# Patient Record
Sex: Male | Born: 1995 | Hispanic: Yes | Marital: Single | Smoking: Never smoker
Health system: Southern US, Community
[De-identification: ages and names within clinical notes are randomized; demographics above are authoritative.]

---
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
## 2017-05-05 ENCOUNTER — Encounter: Payer: Self-pay | Admitting: Emergency Medicine

## 2017-05-05 ENCOUNTER — Emergency Department
Admission: EM | Admit: 2017-05-05 | Discharge: 2017-05-06 | Disposition: A | Discharge status: Home / Self Care | Payer: Medicaid Other | Attending: Emergency Medicine | Admitting: Emergency Medicine

## 2017-05-05 DIAGNOSIS — Z79899 Other long term (current) drug therapy: Secondary | ICD-10-CM | POA: Insufficient documentation

## 2017-05-05 DIAGNOSIS — N3001 Acute cystitis with hematuria: Secondary | ICD-10-CM | POA: Insufficient documentation

## 2017-05-05 LAB — URINALYSIS, COMPLETE (UACMP) WITH MICROSCOPIC
Bilirubin Urine: NEGATIVE
Glucose, UA: NEGATIVE mg/dL
KETONES UR: NEGATIVE mg/dL
Nitrite: NEGATIVE
PROTEIN: 100 mg/dL — AB
SQUAMOUS EPITHELIAL / LPF: NONE SEEN
Specific Gravity, Urine: 1.024 (ref 1.005–1.030)
pH: 5 (ref 5.0–8.0)

## 2017-05-05 NOTE — ED Triage Notes (Signed)
Patient comes to ER with co increased frequency and burning urination for the last week.  He says it has had some blood in it.  VS are WNL

## 2017-05-05 NOTE — ED Notes (Signed)
Post void residual- 0 ml.

## 2017-05-05 NOTE — ED Notes (Signed)
Urine sent on patient.

## 2017-05-05 NOTE — ED Provider Notes (Signed)
[facility] Emergency Department Provider Note  ____________________________________________   First MD Initiated Contact with Patient 05/05/17 2352     (approximate)  I have reviewed the triage vital signs and the nursing notes.   HISTORY  Chief Complaint Dysuria    HPI Jason Ford is a 21 y.o. male who comes to the emergency department with 4 days of hematuria dysuria and lower abdominal discomfort. He denies fevers or chills. He denies flank pain. He was last sexually active 5 months ago with a single partner although he denies condom use.He denies testicular pain. He denies penile discharge. His pain is worse when urinating improved when not urinating. It is moderate severity.    History reviewed. No pertinent past medical history.  There are no active problems to display for this patient.   History reviewed. No pertinent surgical history.  Prior to Admission medications   Medication Sig Start Date End Date Taking? Authorizing Provider  amoxicillin-clavulanate (AUGMENTIN) 875-125 MG per tablet Take 1 tablet by mouth 2 (two) times daily. 04/29/15   Darci CurrentBrown, [place] N, MD  cephALEXin (KEFLEX) 500 MG capsule Take 1 capsule (500 mg total) by mouth 4 (four) times daily. 05/06/17 05/16/17  Merrily Brittleifenbark, Tiffony Kite, MD  cyclobenzaprine (FLEXERIL) 5 MG tablet Take 1 tablet (5 mg total) by mouth every 8 (eight) hours as needed for muscle spasms. 04/02/15   Evon SlackGaines, Thomas C, PA-C  ibuprofen (ADVIL,MOTRIN) 800 MG tablet Take 1 tablet (800 mg total) by mouth every 8 (eight) hours as needed. 04/03/15   Evon SlackGaines, Thomas C, PA-C  ibuprofen (ADVIL,MOTRIN) 800 MG tablet Take 1 tablet (800 mg total) by mouth every 8 (eight) hours as needed for moderate pain. 04/29/15   Joni ReiningSmith, Ronald K, PA-C  traMADol (ULTRAM) 50 MG tablet Take 1 tablet (50 mg total) by mouth every 6 (six) hours as needed for moderate pain. 04/29/15   Joni ReiningSmith, Ronald K, PA-C    Allergies Patient has no known  allergies.  History reviewed. No pertinent family history.  Social History Social History  Substance Use Topics  . Smoking status: Never Smoker  . Smokeless tobacco: Never Used  . Alcohol use No    Review of Systems Constitutional: No fever/chills ENT: No sore throat. Cardiovascular: Denies chest pain. Respiratory: Denies shortness of breath. Gastrointestinal: Positive abdominal pain.  No nausea, no vomiting.  No diarrhea.  No constipation. Musculoskeletal: Negative for back pain. Neurological: Negative for headaches   ____________________________________________   PHYSICAL EXAM:  VITAL SIGNS: ED Triage Vitals  Enc Vitals Group     BP 05/05/17 2154 128/71     Pulse Rate 05/05/17 2154 79     Resp 05/05/17 2154 18     Temp 05/05/17 2154 99.4 F (37.4 C)     Temp Source 05/05/17 2154 Oral     SpO2 05/05/17 2154 98 %     Weight 05/05/17 2154 193 lb (87.5 kg)     Height 05/05/17 2154 5\' 7"  (1.702 m)     Head Circumference --      Peak Flow --      Pain Score 05/05/17 2150 8     Pain Loc --      Pain Edu? --      Excl. in GC? --     Constitutional: Alert and oriented 4 pleasant cooperative speaks in full clear sentences Head: Atraumatic. Nose: No congestion/rhinnorhea. Mouth/Throat: No trismus Neck: No stridor.   Cardiovascular: Regular rate and rhythm Respiratory: Normal respiratory effort.  No  retractions. Gastrointestinal: Soft nondistended nontender no rebound or guarding no peritonitis no McBurney's tenderness negative Rovsing's no costovertebral tenderness normal phallus with no discharge normal testes in normal lie no tenderness Neurologic:  Normal speech and language. No gross focal neurologic deficits are appreciated.  Skin:  Skin is warm, dry and intact. No rash noted.    ____________________________________________  LABS (all labs ordered are listed, but only abnormal results are displayed)  Labs Reviewed  CHLAMYDIA/NGC RT PCR ([facility]  ONLY) - Abnormal; Notable for the following:       Result Value   Chlamydia Tr DETECTED (*)    All other components within normal limits  URINALYSIS, COMPLETE (UACMP) WITH MICROSCOPIC - Abnormal; Notable for the following:    Color, Urine AMBER (*)    APPearance TURBID (*)    Hgb urine dipstick LARGE (*)    Protein, ur 100 (*)    Leukocytes, UA LARGE (*)    Bacteria, UA FEW (*)    All other components within normal limits  URINE CULTURE    Urinalysis positive for large amount of whites but no nitrates  Chlamydia positive __________________________________________  EKG   ____________________________________________  RADIOLOGY  CT scan renal protocol normal ____________________________________________   PROCEDURES  Procedure(s) performed: no  Procedures  Critical Care performed: no  Observation: no ____________________________________________   INITIAL IMPRESSION / ASSESSMENT AND PLAN / ED COURSE  Pertinent labs & imaging results that were available during my care of the patient were reviewed by me and considered in my medical decision making (see chart for details).  The patient arrives well-appearing and hemodynamically stable. His urinalysis has a large amount of whites but no nitrates although does have some bacteria. He denies recent sexual activity and he has no testicular pain or discomfort whatsoever. He does have some flank pain and hematuria concerning for stone so I obtained a CT stone protocol which is fortunately negative. I offered the patient treatment for sexually transmitted infections versus waiting for the results he opted for treatment which I think is reasonable. I will also treat him for UTI. I'll call him back with results.     ----------------------------------------- 6:53 AM on 05/06/2017 -----------------------------------------  I attempted twice to call the patient to let him know he does not need to take his cephalexin over  his phone number go straight to voicemail. Try again later. ____________________________________________   FINAL CLINICAL IMPRESSION(S) / ED DIAGNOSES  Final diagnoses:  Acute cystitis with hematuria      NEW MEDICATIONS STARTED DURING THIS VISIT:  Discharge Medication List as of 05/06/2017  1:28 AM    START taking these medications   Details  cephALEXin (KEFLEX) 500 MG capsule Take 1 capsule (500 mg total) by mouth 4 (four) times daily., Starting Sun 05/06/2017, Until Wed 05/16/2017, Print         Note:  This document was prepared using Dragon voice recognition software and may include unintentional dictation errors.      Merrily Brittleifenbark, Edrick Whitehorn, MD 05/06/17 [phone]

## 2017-05-06 ENCOUNTER — Emergency Department: Payer: Medicaid Other

## 2017-05-06 LAB — CHLAMYDIA/NGC RT PCR (ARMC ONLY)
Chlamydia Tr: DETECTED — AB
N gonorrhoeae: NOT DETECTED

## 2017-05-06 MED ORDER — CEPHALEXIN 500 MG PO CAPS
500.0000 mg | ORAL_CAPSULE | Freq: Four times a day (QID) | ORAL | 0 refills | Status: AC
Start: 2017-05-06 — End: 2017-05-16

## 2017-05-06 MED ORDER — CEFTRIAXONE SODIUM 250 MG IJ SOLR
250.0000 mg | Freq: Once | INTRAMUSCULAR | Status: AC
  Administered 2017-05-06: 250 mg via INTRAMUSCULAR
  Filled 2017-05-06: qty 250

## 2017-05-06 MED ORDER — ONDANSETRON 4 MG PO TBDP
8.0000 mg | ORAL_TABLET | Freq: Once | ORAL | Status: AC
  Administered 2017-05-06: 8 mg via ORAL
  Filled 2017-05-06: qty 2

## 2017-05-06 MED ORDER — AZITHROMYCIN 500 MG PO TABS
1000.0000 mg | ORAL_TABLET | Freq: Once | ORAL | Status: AC
  Administered 2017-05-06: 1000 mg via ORAL
  Filled 2017-05-06: qty 2

## 2017-05-06 NOTE — ED Notes (Signed)
Signature pad not working, pt verbalizes understanding of prescription, discharge, and follow up instructions.

## 2017-05-06 NOTE — Discharge Instructions (Signed)
Please take all of your antibiotics as prescribed and make an appointment to establish care with a primary care physician this coming week for reevaluation. Return to the emergency department sooner for any new or worsening symptoms such as a virus, chills, worsening pain, if you cannot eat or drink, or for any other concerns whatsoever.  It was a pleasure to take care of you today, and thank you for coming to our emergency department.  If you have any questions or concerns before leaving please ask the nurse to grab me and I'm more than happy to go through your aftercare instructions again.  If you were prescribed any opioid pain medication today such as Norco, Vicodin, Percocet, morphine, hydrocodone, or oxycodone please make sure you do not drive when you are taking this medication as it can alter your ability to drive safely.  If you have any concerns once you are home that you are not improving or are in fact getting worse before you can make it to your follow-up appointment, please do not hesitate to call 911 and come back for further evaluation.  Merrily BrittleNeil Delayza Lungren, MD  Results for orders placed or performed during the hospital encounter of 05/05/17  Urinalysis, Complete w Microscopic  Result Value Ref Range   Color, Urine AMBER (A) YELLOW   APPearance TURBID (A) CLEAR   Specific Gravity, Urine 1.024 1.005 - 1.030   pH 5.0 5.0 - 8.0   Glucose, UA NEGATIVE NEGATIVE mg/dL   Hgb urine dipstick LARGE (A) NEGATIVE   Bilirubin Urine NEGATIVE NEGATIVE   Ketones, ur NEGATIVE NEGATIVE mg/dL   Protein, ur 409100 (A) NEGATIVE mg/dL   Nitrite NEGATIVE NEGATIVE   Leukocytes, UA LARGE (A) NEGATIVE   RBC / HPF TOO NUMEROUS TO COUNT 0 - 5 RBC/hpf   WBC, UA TOO NUMEROUS TO COUNT 0 - 5 WBC/hpf   Bacteria, UA FEW (A) NONE SEEN   Squamous Epithelial / LPF NONE SEEN NONE SEEN   WBC Clumps PRESENT    Mucous PRESENT    Ca Oxalate Crys, UA PRESENT    Ct Renal Stone Study  Result Date: 05/06/2017 CLINICAL  DATA:  Acute onset of dysuria and increased urinary frequency. Hematuria. Flank pain. Initial encounter. EXAM: CT ABDOMEN AND PELVIS WITHOUT CONTRAST TECHNIQUE: Multidetector CT imaging of the abdomen and pelvis was performed following the standard protocol without IV contrast. COMPARISON:  None. FINDINGS: Lower chest: The visualized lung bases are grossly clear. The visualized portions of the mediastinum are unremarkable. Hepatobiliary: The liver is unremarkable in appearance. The gallbladder is unremarkable in appearance. The common bile duct remains normal in caliber. Pancreas: The pancreas is within normal limits. Spleen: The spleen is unremarkable in appearance. Adrenals/Urinary Tract: The adrenal glands are unremarkable in appearance. The kidneys are within normal limits. There is no evidence of hydronephrosis. No renal or ureteral stones are identified. No perinephric stranding is seen. Stomach/Bowel: The stomach is unremarkable in appearance. The small bowel is within normal limits. The appendix is normal in caliber, without evidence of appendicitis. The appendix tracks adjacent to the liver. The colon is unremarkable in appearance. Vascular/Lymphatic: The abdominal aorta is unremarkable in appearance. The inferior vena cava is grossly unremarkable. No retroperitoneal lymphadenopathy is seen. No pelvic sidewall lymphadenopathy is identified. Reproductive: Mild soft tissue inflammation about the bladder could reflect mild cystitis. The prostate remains normal in size. Other: No additional soft tissue abnormalities are seen. Musculoskeletal: No acute osseous abnormalities are identified. The visualized musculature is unremarkable in appearance. IMPRESSION:  Mild soft tissue inflammation about the bladder could reflect mild cystitis. No evidence of hydronephrosis. No renal or ureteral stones seen. Electronically Signed   By: Roanna RaiderJeffery  Chang M.D.   On: 05/06/2017 00:38

## 2017-05-07 ENCOUNTER — Telehealth: Payer: Self-pay | Admitting: Emergency Medicine

## 2017-05-07 NOTE — Telephone Encounter (Signed)
Called patient to inform of std test results and need to stop the cephalexin.  Explained about the tests and need for partner treatment.  Patient says he understands.

## 2017-05-08 LAB — URINE CULTURE

## 2020-03-29 ENCOUNTER — Emergency Department
Admission: EM | Admit: 2020-03-29 | Discharge: 2020-03-29 | Disposition: A | Discharge status: Home / Self Care | Payer: Self-pay | Attending: Emergency Medicine | Admitting: Emergency Medicine

## 2020-03-29 ENCOUNTER — Emergency Department: Payer: Self-pay

## 2020-03-29 ENCOUNTER — Encounter: Payer: Self-pay | Admitting: Emergency Medicine

## 2020-03-29 ENCOUNTER — Other Ambulatory Visit: Payer: Self-pay

## 2020-03-29 DIAGNOSIS — S83412A Sprain of medial collateral ligament of left knee, initial encounter: Secondary | ICD-10-CM | POA: Insufficient documentation

## 2020-03-29 DIAGNOSIS — M7122 Synovial cyst of popliteal space [Baker], left knee: Secondary | ICD-10-CM | POA: Insufficient documentation

## 2020-03-29 DIAGNOSIS — X58XXXA Exposure to other specified factors, initial encounter: Secondary | ICD-10-CM | POA: Insufficient documentation

## 2020-03-29 DIAGNOSIS — Y929 Unspecified place or not applicable: Secondary | ICD-10-CM | POA: Insufficient documentation

## 2020-03-29 DIAGNOSIS — S83402A Sprain of unspecified collateral ligament of left knee, initial encounter: Secondary | ICD-10-CM

## 2020-03-29 DIAGNOSIS — Y999 Unspecified external cause status: Secondary | ICD-10-CM | POA: Insufficient documentation

## 2020-03-29 DIAGNOSIS — Y9366 Activity, soccer: Secondary | ICD-10-CM | POA: Insufficient documentation

## 2020-03-29 MED ORDER — NAPROXEN 500 MG PO TABS: 500.0000 mg | ORAL_TABLET | Freq: Two times a day (BID) | ORAL | 0 refills | Status: AC

## 2020-03-29 NOTE — ED Notes (Signed)
See triage note  Presents with pain to left knee   States he developed pain after playing soccer  Min swelling note  Increased pain with standing

## 2020-03-29 NOTE — Discharge Instructions (Addendum)
Follow discharge care instructions.  Wear knee support for 3 to 5 days as needed.  No sports activities for at least 1 week.

## 2020-03-29 NOTE — ED Triage Notes (Signed)
Patient states that he was playing soccer yesterday afternoon and hurt his knee.

## 2020-03-29 NOTE — ED Provider Notes (Signed)
[facility] Emergency Department Provider Note   ____________________________________________   First MD Initiated Contact with Patient 03/29/20 [phone]     (approximate)  I have reviewed the triage vital signs and the nursing notes.   HISTORY  Chief Complaint Knee Pain    HPI Jason Ford is a 24 y.o. male patient complain left knee pain secondary to joint injury while playing soccer yesterday afternoon.  Patient state he was hit from the side and fell down.  Patient state he was able to continue playing but after the game he started noticing increased pain to the medial aspect of his left knee.  Patient can bear weight with difficulty.  Patient today feels like the knee is going to "give out".  Patient rates his pain as a 2/10.  Patient described pain is "achy".  No palliative measure for complaint.         History reviewed. No pertinent past medical history.  There are no problems to display for this patient.   History reviewed. No pertinent surgical history.  Prior to Admission medications   Medication Sig Start Date End Date Taking? Authorizing Provider  naproxen (NAPROSYN) 500 MG tablet Take 1 tablet (500 mg total) by mouth 2 (two) times daily with a meal. 03/29/20   Sable Feil, PA-C    Allergies Patient has no known allergies.  No family history on file.  Social History Social History   Tobacco Use   Smoking status: Never Smoker   Smokeless tobacco: Never Used  Substance Use Topics   Alcohol use: Yes    Comment: occ   Drug use: Yes    Types: Marijuana    Review of Systems Constitutional: No fever/chills Eyes: No visual changes. ENT: No sore throat. Cardiovascular: Denies chest pain. Respiratory: Denies shortness of breath. Gastrointestinal: No abdominal pain.  No nausea, no vomiting.  No diarrhea.  No constipation. Genitourinary: Negative for dysuria. Musculoskeletal: Left knee pain. Skin: Negative for  rash. Neurological: Negative for headaches, focal weakness or numbness.   ____________________________________________   PHYSICAL EXAM:  VITAL SIGNS: ED Triage Vitals  Enc Vitals Group     BP 03/29/20 0243 112/64     Pulse Rate 03/29/20 0243 70     Resp --      Temp 03/29/20 0243 97.9 F (36.6 C)     Temp Source 03/29/20 0243 Oral     SpO2 03/29/20 0243 99 %     Weight 03/29/20 0243 185 lb (83.9 kg)     Height 03/29/20 0243 5\' 7"  (1.702 m)     Head Circumference --      Peak Flow --      Pain Score 03/29/20 0254 0     Pain Loc --      Pain Edu? --      Excl. in [place]? --    Constitutional: Alert and oriented. Well appearing and in no acute distress. Cardiovascular: Normal rate, regular rhythm. Grossly normal heart sounds.  Good peripheral circulation. Respiratory: Normal respiratory effort.  No retractions. Lungs CTAB. Musculoskeletal: No obvious deformity to the left knee.  Patient is moderate guarding palpation of the anterior patella.  Patient has full and equal range of motion.  Mild joint effusions. Neurologic:  Normal speech and language. No gross focal neurologic deficits are appreciated. No gait instability. Skin:  Skin is warm, dry and intact. No rash noted.  No abrasion or ecchymosis. Psychiatric: Mood and affect are normal. Speech and behavior are normal.  ____________________________________________   LABS (all labs ordered are listed, but only abnormal results are displayed)  Labs Reviewed - No data to display ____________________________________________  EKG   ____________________________________________  RADIOLOGY  ED MD interpretation:    Official radiology report(s): DG Knee Complete 4 Views Left  Result Date: 03/29/2020 CLINICAL DATA:  Left knee pain and swelling after twisting injury playing soccer 1 day ago. EXAM: LEFT KNEE - COMPLETE 4+ VIEW COMPARISON:  None. FINDINGS: No evidence of fracture or dislocation. Normal alignment and joint spaces.  No evidence of arthropathy or other focal bone abnormality. Minimal joint effusion. Mild soft tissue edema. IMPRESSION: Minimal joint effusion and soft tissue edema. No fracture or subluxation. Electronically Signed   By: Narda Rutherford M.D.   On: 03/29/2020 03:18    ____________________________________________   PROCEDURES  Procedure(s) performed (including Critical Care):  Procedures   ____________________________________________   INITIAL IMPRESSION / ASSESSMENT AND PLAN / ED COURSE  As part of my medical decision making, I reviewed the following data within the electronic MEDICAL RECORD NUMBER   Patient presents with medial knee pain secondary to being hit while playing soccer yesterday.  Discussed x-ray findings with patient and significant only for mild effusion.  Patient complaining physical exam consistent with knee sprain.  Patient placed in a knee immobilizer and given discharge care instruction.  Patient advised follow orthopedic if no improvement 3 to 5 days.    Jason Ford was evaluated in Emergency Department on 03/29/2020 for the symptoms described in the history of present illness. He was evaluated in the context of the global COVID-19 pandemic, which necessitated consideration that the patient might be at risk for infection with the SARS-CoV-2 virus that causes COVID-19. Institutional protocols and algorithms that pertain to the evaluation of patients at risk for COVID-19 are in a state of rapid change based on information released by regulatory bodies including the CDC and federal and state organizations. These policies and algorithms were followed during the patient's care in the ED.       ____________________________________________   FINAL CLINICAL IMPRESSION(S) / ED DIAGNOSES  Final diagnoses:  Sprain of collateral ligament of left knee, initial encounter  Baker's cyst of knee, left     ED Discharge Orders         Ordered    naproxen (NAPROSYN) 500 MG  tablet  2 times daily with meals     Discontinue  Reprint     03/29/20 0745           Note:  This document was prepared using Dragon voice recognition software and may include unintentional dictation errors.    Joni Reining, PA-C 03/29/20 0750    Emily Filbert, MD 03/29/20 1504

## 2021-04-06 IMAGING — CR DG KNEE COMPLETE 4+V*L*
4 series · 4 of 4 positions shown · non-contrast
Comparison: None.

CLINICAL DATA: Left knee pain and swelling after twisting injury
playing soccer 1 day ago.

EXAM:
LEFT KNEE - COMPLETE 4+ VIEW

[knee ap]
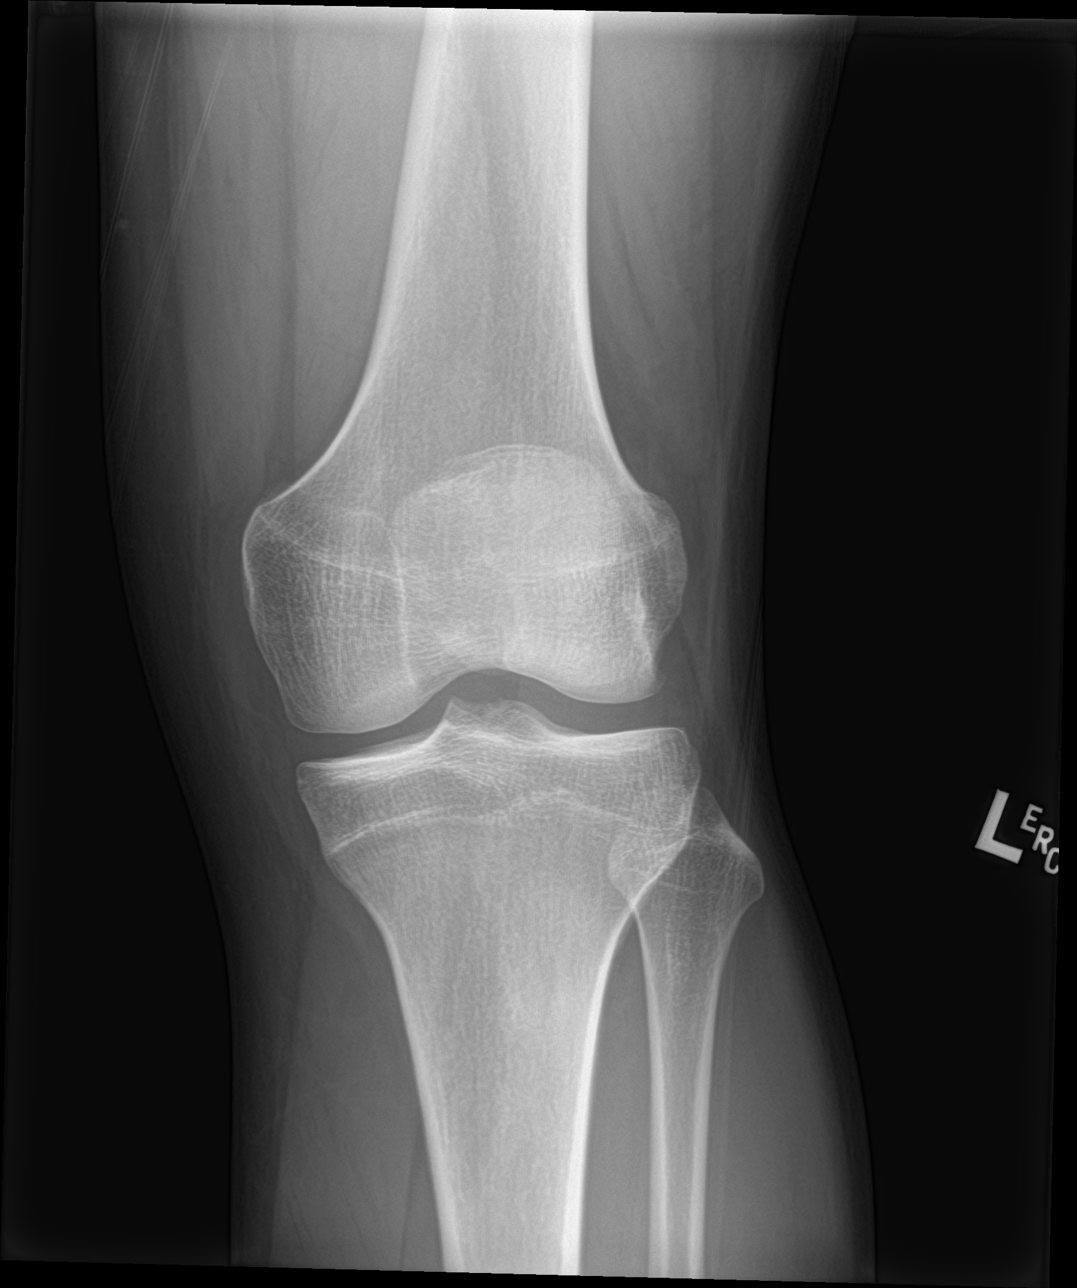

[knee obl (1 of 2)]
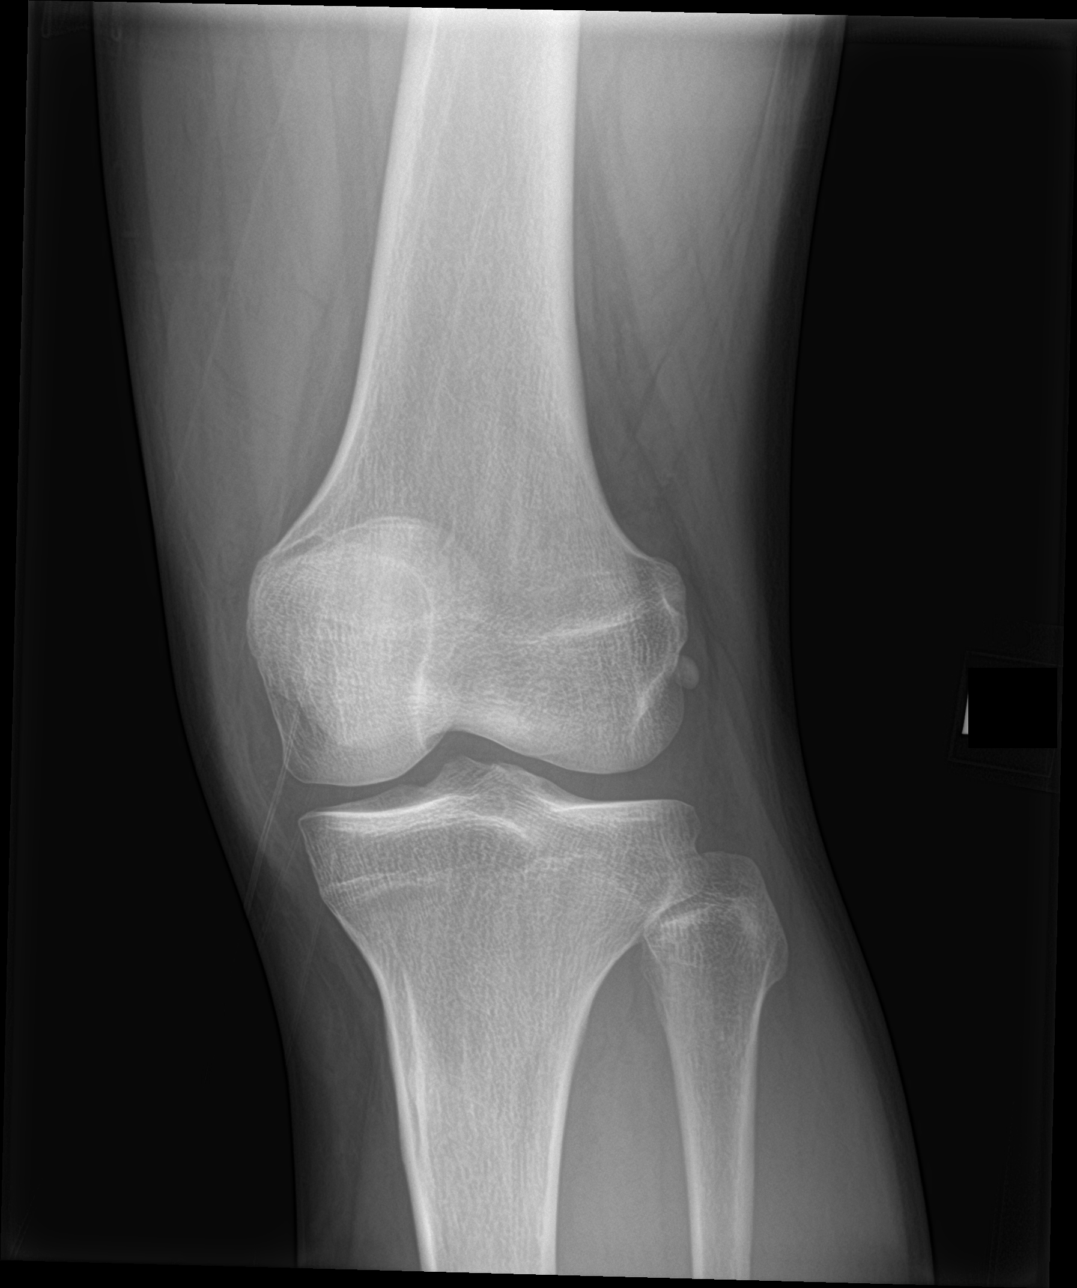

[knee obl (2 of 2)]
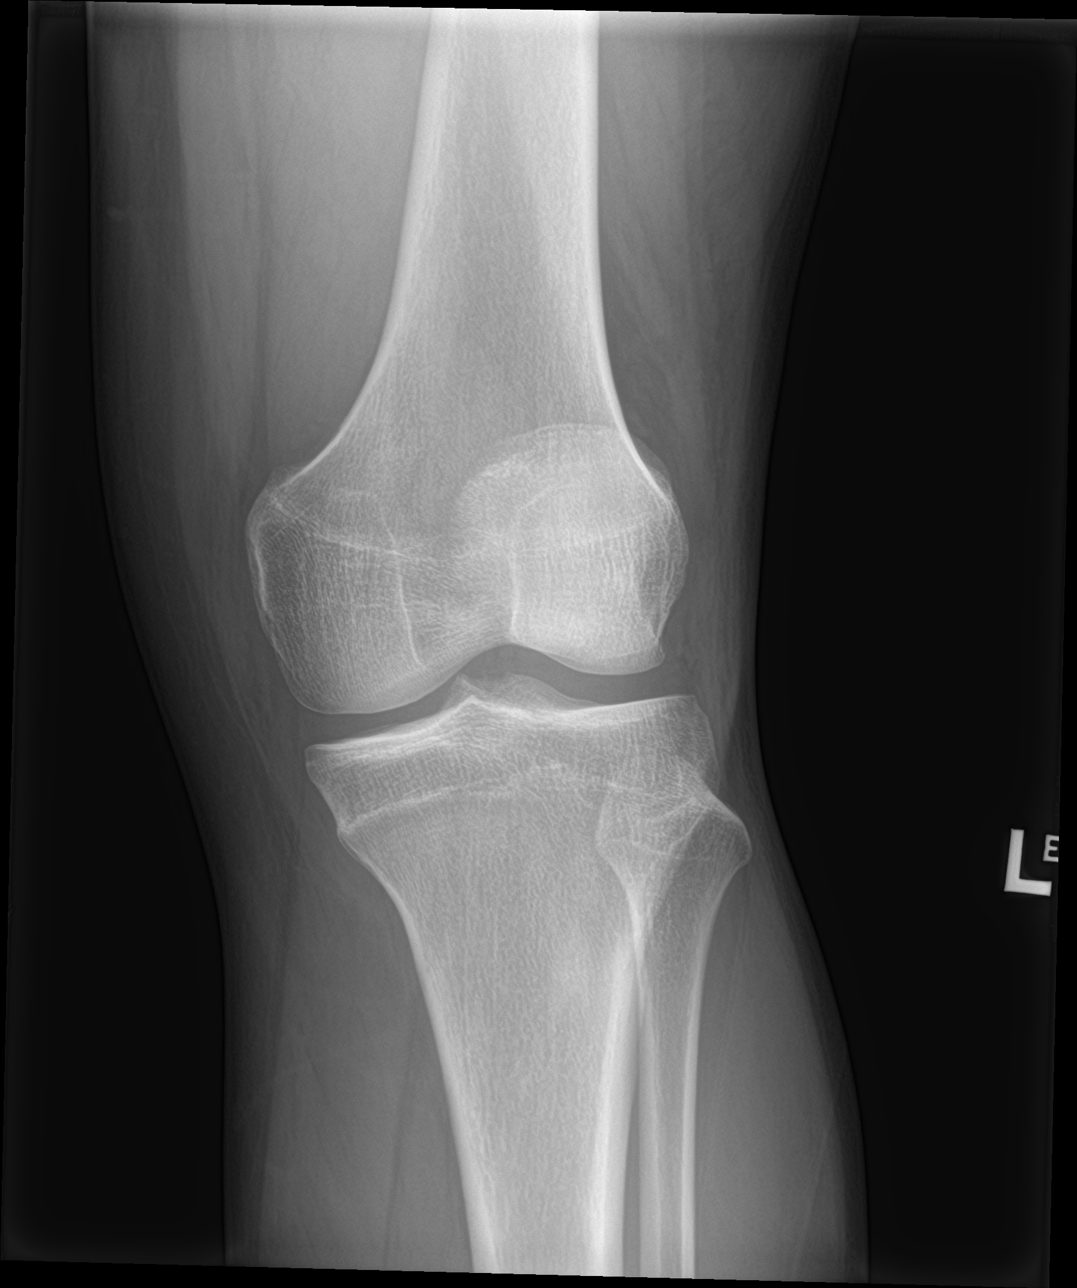

[knee lat]
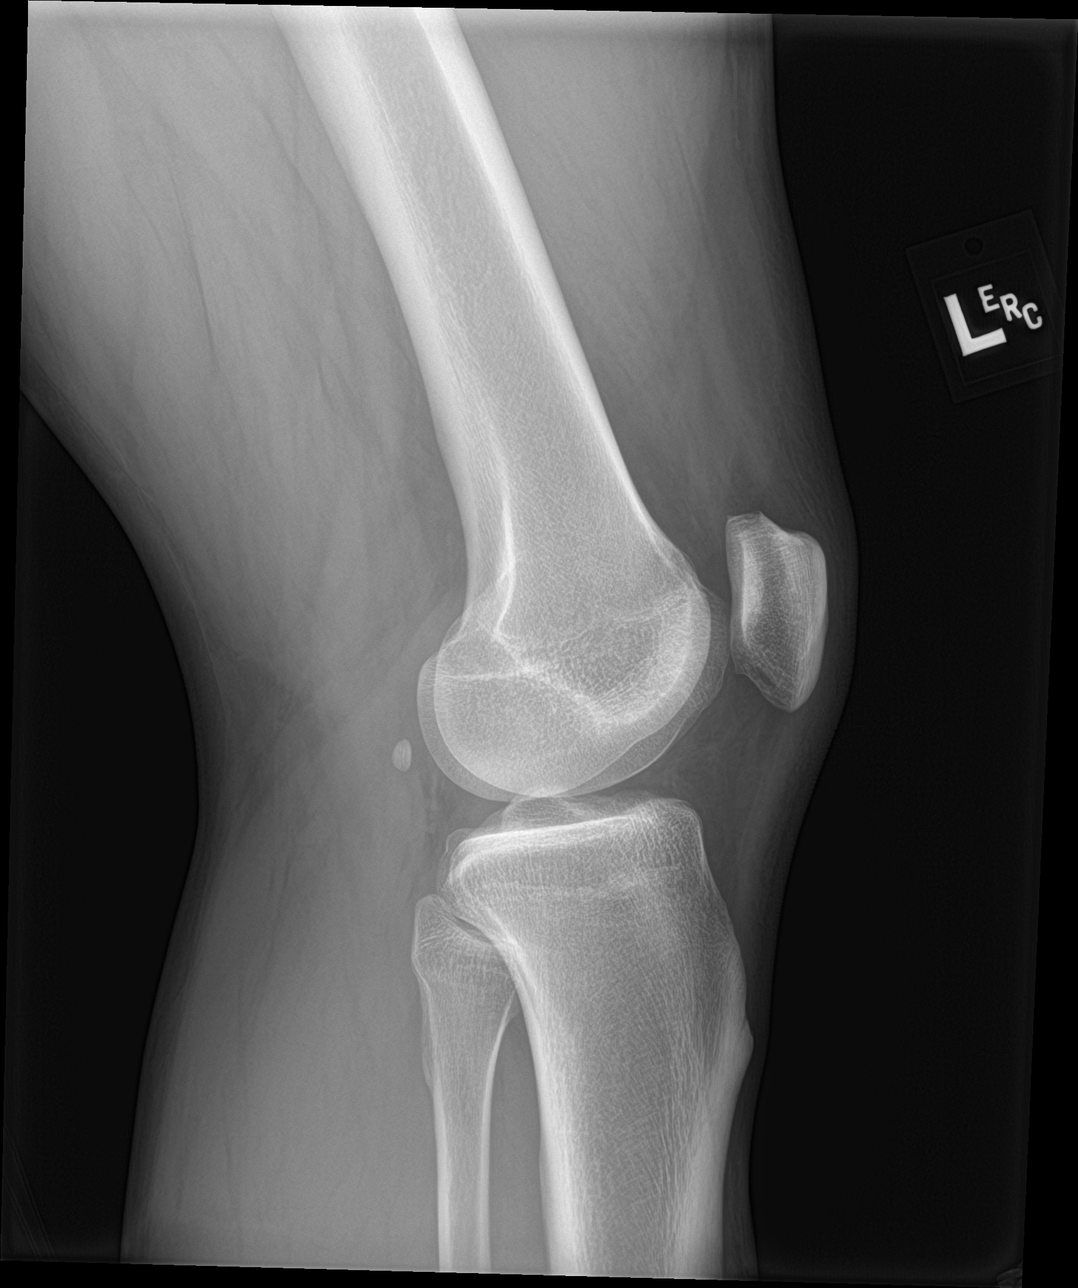

[4 of 4 positions shown; findings below may reference images not displayed]

FINDINGS: No evidence of fracture or dislocation. Normal alignment and joint
spaces. No evidence of arthropathy or other focal bone abnormality.
Minimal joint effusion. Mild soft tissue edema.
IMPRESSION: Minimal joint effusion and soft tissue edema. No fracture or
subluxation.
